# Patient Record
Sex: Male | Born: 2000 | Race: Black or African American | Hispanic: No | Marital: Single | Smoking: Never smoker
Health system: Southern US, Community
[De-identification: ages and names within clinical notes are randomized; demographics above are authoritative.]

## PROBLEM LIST (undated history)

## (undated) DIAGNOSIS — L309 Dermatitis, unspecified: Secondary | ICD-10-CM

## (undated) DIAGNOSIS — N189 Chronic kidney disease, unspecified: Secondary | ICD-10-CM

## (undated) DIAGNOSIS — J45909 Unspecified asthma, uncomplicated: Secondary | ICD-10-CM

---

## 2018-02-04 ENCOUNTER — Other Ambulatory Visit: Payer: Self-pay

## 2018-02-04 ENCOUNTER — Encounter (HOSPITAL_COMMUNITY): Payer: Self-pay | Admitting: Emergency Medicine

## 2018-02-04 ENCOUNTER — Emergency Department (HOSPITAL_COMMUNITY)
Admission: EM | Admit: 2018-02-04 | Discharge: 2018-02-04 | Disposition: A | Discharge status: Home / Self Care | Payer: No Typology Code available for payment source | Attending: Emergency Medicine | Admitting: Emergency Medicine

## 2018-02-04 ENCOUNTER — Emergency Department (HOSPITAL_COMMUNITY): Payer: No Typology Code available for payment source

## 2018-02-04 DIAGNOSIS — J4521 Mild intermittent asthma with (acute) exacerbation: Secondary | ICD-10-CM | POA: Diagnosis not present

## 2018-02-04 DIAGNOSIS — R05 Cough: Secondary | ICD-10-CM | POA: Diagnosis present

## 2018-02-04 DIAGNOSIS — B9789 Other viral agents as the cause of diseases classified elsewhere: Secondary | ICD-10-CM | POA: Diagnosis not present

## 2018-02-04 DIAGNOSIS — Z79899 Other long term (current) drug therapy: Secondary | ICD-10-CM | POA: Insufficient documentation

## 2018-02-04 DIAGNOSIS — J069 Acute upper respiratory infection, unspecified: Secondary | ICD-10-CM | POA: Diagnosis not present

## 2018-02-04 HISTORY — DX: Dermatitis, unspecified: L30.9

## 2018-02-04 HISTORY — DX: Unspecified asthma, uncomplicated: J45.909

## 2018-02-04 MED ORDER — ALBUTEROL SULFATE (2.5 MG/3ML) 0.083% IN NEBU
5.0000 mg | INHALATION_SOLUTION | Freq: Once | RESPIRATORY_TRACT | Status: AC
  Administered 2018-02-04: 5 mg via RESPIRATORY_TRACT
  Filled 2018-02-04: qty 6

## 2018-02-04 MED ORDER — ALBUTEROL SULFATE HFA 108 (90 BASE) MCG/ACT IN AERS
2.0000 | INHALATION_SPRAY | RESPIRATORY_TRACT | Status: DC | PRN
  Administered 2018-02-04: 2 via RESPIRATORY_TRACT
  Filled 2018-02-04: qty 6.7

## 2018-02-04 MED ORDER — IPRATROPIUM BROMIDE 0.02 % IN SOLN
0.5000 mg | Freq: Once | RESPIRATORY_TRACT | Status: AC
  Administered 2018-02-04: 0.5 mg via RESPIRATORY_TRACT
  Filled 2018-02-04: qty 2.5

## 2018-02-04 MED ORDER — AEROCHAMBER PLUS FLO-VU MEDIUM MISC
1.0000 | Freq: Once | Status: AC
  Administered 2018-02-04: 1
  Filled 2018-02-04: qty 1

## 2018-02-04 MED ORDER — ALBUTEROL SULFATE HFA 108 (90 BASE) MCG/ACT IN AERS: 2.0000 | INHALATION_SPRAY | RESPIRATORY_TRACT | 3 refills | Status: DC | PRN

## 2018-02-04 MED ORDER — PREDNISONE 20 MG PO TABS: 40.0000 mg | ORAL_TABLET | Freq: Every day | ORAL | 0 refills | Status: DC

## 2018-02-04 MED ORDER — PREDNISONE 20 MG PO TABS
60.0000 mg | ORAL_TABLET | Freq: Once | ORAL | Status: AC
  Administered 2018-02-04: 60 mg via ORAL
  Filled 2018-02-04: qty 3

## 2018-02-04 NOTE — Discharge Instructions (Addendum)
1. Medications: albuterol, prednisone, usual home medications °2. Treatment: rest, drink plenty of fluids, begin OTC antihistamine (Zyrtec or Claritin)  °3. Follow Up: Please followup with your primary doctor in 2-3 days for discussion of your diagnoses and further evaluation after today's visit; if you do not have a primary care doctor use the resource guide provided to find one; Please return to the ER for difficulty breathing, high fevers or worsening symptoms. ° °

## 2018-02-04 NOTE — ED Triage Notes (Signed)
Pt reports waking with wheezing and difficulty breathing tonight. Pt reports prior hx of asthma and not taking any medications.

## 2018-02-04 NOTE — ED Provider Notes (Signed)
[facility]-EMERGENCY DEPT Provider Note   CSN: [id number] Arrival date & time: 02/04/18  0101     History   Chief Complaint Chief Complaint  Patient presents with  . Asthma    HPI Juan Pacheco is a 17 y.o. male with a hx of asthma presents to the Emergency Department complaining of gradual, persistent, progressively worsening cough, wheezing and shortness of breath onset 3 days ago.  Mother reports patient has had URI symptoms for approximately 1 week including cough, congestion, postnasal drip.  Patient reports associated sore throat as well.  Mother denies use of albuterol, Atrovent or steroids in the last 10 years.  She denies previous hospitalization or intubation for his asthma.  Patient reports that he requested transport to the emergency department tonight because he felt very short of breath at home.  On arrival, he was given albuterol treatment and he reports this has made him significantly better.  Nothing at home made him significantly worse.  Patient and mother deny fever, chills, headache, neck pain, neck stiffness, chest pain, abdominal pain, nausea, vomiting, diarrhea, weakness, dizziness, syncope.  The history is provided by the patient, medical records and a parent. No language interpreter was used.    Past Medical History:  Diagnosis Date  . Asthma   . Eczema     There are no active problems to display for this patient.   History reviewed. No pertinent surgical history.      Home Medications    Prior to Admission medications   Medication Sig Start Date End Date Taking? Authorizing Provider  albuterol (PROVENTIL HFA;VENTOLIN HFA) 108 (90 Base) MCG/ACT inhaler Inhale 2 puffs into the lungs every 4 (four) hours as needed for wheezing or shortness of breath. 02/04/18   Kemper Heupel, Dahlia Client, PA-C  predniSONE (DELTASONE) 20 MG tablet Take 2 tablets (40 mg total) by mouth daily. 02/04/18   Arieon Corcoran, Boyd Kerbs    Family History History  reviewed. No pertinent family history.  Social History Social History   Tobacco Use  . Smoking status: Never Smoker  . Smokeless tobacco: Never Used  Substance Use Topics  . Alcohol use: Never    Frequency: Never  . Drug use: Never     Allergies   Peanut-containing drug products   Review of Systems Review of Systems  Constitutional: Negative for appetite change, chills, fatigue and fever.  HENT: Positive for congestion, postnasal drip, rhinorrhea, sinus pressure and sore throat. Negative for ear discharge, ear pain and mouth sores.   Eyes: Negative for visual disturbance.  Respiratory: Positive for cough, chest tightness, shortness of breath and wheezing. Negative for stridor.   Cardiovascular: Negative for chest pain, palpitations and leg swelling.  Gastrointestinal: Negative for abdominal pain, diarrhea, nausea and vomiting.  Genitourinary: Negative for dysuria, frequency, hematuria and urgency.  Musculoskeletal: Negative for arthralgias, back pain, myalgias and neck stiffness.  Skin: Negative for rash.  Neurological: Negative for syncope, light-headedness, numbness and headaches.  Hematological: Negative for adenopathy.  Psychiatric/Behavioral: The patient is not nervous/anxious.   All other systems reviewed and are negative.    Physical Exam Updated Vital Signs BP (!) 120/96 (BP Location: Left Arm)   Pulse 78   Temp 98.2 F (36.8 C) (Oral)   Resp 18   Ht  (1.93 m)   Wt 68 kg (150 lb)   SpO2 100%   BMI 18.26 kg/m   Physical Exam  Constitutional: He appears well-developed and well-nourished. No distress.  HENT:  Head: Normocephalic and atraumatic.  Right Ear: Tympanic membrane, external ear and ear canal normal.  Left Ear: Tympanic membrane, external ear and ear canal normal.  Nose: Mucosal edema and rhinorrhea present. No epistaxis. Right sinus exhibits no maxillary sinus tenderness and no frontal sinus tenderness. Left sinus exhibits no maxillary sinus  tenderness and no frontal sinus tenderness.  Mouth/Throat: Uvula is midline and mucous membranes are normal. Mucous membranes are not pale and not cyanotic. No oropharyngeal exudate, posterior oropharyngeal edema, posterior oropharyngeal erythema or tonsillar abscesses.  Eyes: Pupils are equal, round, and reactive to light. Conjunctivae are normal.  Neck: Normal range of motion and full passive range of motion without pain.  Cardiovascular: Normal rate and intact distal pulses.  Pulmonary/Chest: Effort normal. No accessory muscle usage or stridor. No tachypnea. No respiratory distress. He has wheezes. He has no rhonchi. He has no rales.  Mild, expiratory wheezes throughout  Abdominal: Soft. There is no tenderness.  Musculoskeletal: Normal range of motion.  Lymphadenopathy:    He has no cervical adenopathy.  Neurological: He is alert.  Skin: Skin is warm and dry. No rash noted. He is not diaphoretic.  Psychiatric: He has a normal mood and affect.  Nursing note and vitals reviewed.    ED Treatments / Results   Radiology Dg Chest 2 View  Result Date: 02/04/2018 CLINICAL DATA:  Acute onset of shortness of breath, cough and wheezing. EXAM: CHEST - 2 VIEW COMPARISON:  None. FINDINGS: The lungs are well-aerated and clear. There is no evidence of focal opacification, pleural effusion or pneumothorax. The heart is normal in size; the mediastinal contour is within normal limits. No acute osseous abnormalities are seen. IMPRESSION: No acute cardiopulmonary process seen. Electronically Signed   By: Roanna Raider M.D.   On: 02/04/2018 02:15    Procedures Procedures (including critical care time)  Medications Ordered in ED Medications  albuterol (PROVENTIL HFA;VENTOLIN HFA) 108 (90 Base) MCG/ACT inhaler 2 puff (2 puffs Inhalation Given 02/04/18 0220)  albuterol (PROVENTIL) (2.5 MG/3ML) 0.083% nebulizer solution 5 mg (5 mg Nebulization Given 02/04/18 0115)  predniSONE (DELTASONE) tablet 60 mg (60 mg  Oral Given 02/04/18 0219)  albuterol (PROVENTIL) (2.5 MG/3ML) 0.083% nebulizer solution 5 mg (5 mg Nebulization Given 02/04/18 0222)  ipratropium (ATROVENT) nebulizer solution 0.5 mg (0.5 mg Nebulization Given 02/04/18 0222)  AEROCHAMBER PLUS FLO-VU MEDIUM MISC 1 each (1 each Other Given 02/04/18 0221)     Initial Impression / Assessment and Plan / ED Course  I have reviewed the triage vital signs and the nursing notes.  Pertinent labs & imaging results that were available during my care of the patient were reviewed by me and considered in my medical decision making (see chart for details).  Clinical Course as of Feb 05 239  Tue Feb 04, 2018  0237 No evidence of pneumonia, pneumothorax or pulmonary edema.  I personally evaluated these images.  DG Chest 2 View [HM]  [phone] Patient is afebrile  Temp: 98.2 F (36.8 C) [HM]  0237 No tachycardia.  Pulse Rate: 78 [HM]    Clinical Course User Index [HM] Burris Matherne, Dahlia Client, PA-C    Patient presents with cough and shortness of breath.  He has history of asthma but has not needed medications for many years.  Additionally, patient has URI symptoms.  Chest x-ray without evidence of pneumonia, pneumothorax or pulmonary edema.  Suspect that viral URI/viral bronchitis caused asthma exacerbation.  Patient given initial Nebules treatment in triage with significant improvement in subjective breathing.  Mild expiratory wheezes persist  on my exam.  Will give additional albuterol and reassess.  2:35 AM Patient ambulated in ED with normal O2 saturations, no current signs of respiratory distress. Lung exam improved after nebulizer treatment. Prednisone given in the ED and pt will be discharged with 5 day burst. Pt states they are breathing at baseline. Pt has been instructed to continue using prescribed medications and to speak with PCP about today's exacerbation.    Final Clinical Impressions(s) / ED Diagnoses   Final diagnoses:  Mild intermittent asthma  with exacerbation  Viral URI with cough    ED Discharge Orders        Ordered    predniSONE (DELTASONE) 20 MG tablet  Daily     02/04/18 0234    albuterol (PROVENTIL HFA;VENTOLIN HFA) 108 (90 Base) MCG/ACT inhaler  Every 4 hours PRN     02/04/18 0234       Shant Hence, Dahlia Client, PA-C 02/04/18 0240    Horton, Mayer Masker, MD 02/04/18 [phone]

## 2020-04-07 ENCOUNTER — Other Ambulatory Visit: Payer: Self-pay | Admitting: Internal Medicine

## 2020-04-07 DIAGNOSIS — N1832 Chronic kidney disease, stage 3b: Secondary | ICD-10-CM

## 2020-04-15 ENCOUNTER — Other Ambulatory Visit: Payer: No Typology Code available for payment source

## 2020-05-19 ENCOUNTER — Ambulatory Visit: Payer: Medicaid Other | Attending: Internal Medicine

## 2020-05-19 DIAGNOSIS — Z23 Encounter for immunization: Secondary | ICD-10-CM

## 2020-05-19 NOTE — Progress Notes (Signed)
   Covid-19 Vaccination Clinic  Name:  Juan Pacheco    MRN: [id number] DOB: September 03, 2001  05/19/2020  Mr. Juan Pacheco was observed post Covid-19 immunization for 30 minutes based on pre-vaccination screening without incident. He was provided with Vaccine Information Sheet and instruction to access the V-Safe system.   Mr. Juan Pacheco was instructed to call 911 with any severe reactions post vaccine: [name] Difficulty breathing  . Swelling of face and throat  . A fast heartbeat  . A bad rash all over body  . Dizziness and weakness   Immunizations Administered    Name Date Dose VIS Date Route   Pfizer COVID-19 Vaccine 05/19/2020 12:09 PM 0.3 mL 11/04/2018 Intramuscular   Manufacturer: [employer], [employer]   Lot: [id number]   NDC: [address]-5

## 2020-06-14 ENCOUNTER — Ambulatory Visit: Payer: Medicaid Other

## 2020-06-16 ENCOUNTER — Ambulatory Visit: Payer: Medicaid Other | Attending: Internal Medicine

## 2020-06-16 DIAGNOSIS — Z23 Encounter for immunization: Secondary | ICD-10-CM

## 2020-06-16 NOTE — Progress Notes (Signed)
   Covid-19 Vaccination Clinic  Name:  [name]    MRN: [id number] DOB: 09-08-2001  06/16/2020  Mr. [name] was observed post Covid-19 immunization for 15 minutes without incident. He was provided with Vaccine Information Sheet and instruction to access the V-Safe system.   Mr. Grivas was instructed to call 911 with any severe reactions post vaccine: [name] Difficulty breathing  . Swelling of face and throat  . A fast heartbeat  . A bad rash all over body  . Dizziness and weakness   Immunizations Administered    Name Date Dose VIS Date Route   Pfizer COVID-19 Vaccine 06/16/2020  9:51 AM 0.3 mL 11/04/2018 Intramuscular   Manufacturer: [employer], [employer]   Lot: [id number]   NDC: [address]-3

## 2020-07-25 ENCOUNTER — Other Ambulatory Visit: Payer: Self-pay | Admitting: Nephrology

## 2020-07-25 DIAGNOSIS — R7989 Other specified abnormal findings of blood chemistry: Secondary | ICD-10-CM

## 2020-07-25 DIAGNOSIS — R809 Proteinuria, unspecified: Secondary | ICD-10-CM

## 2020-08-03 ENCOUNTER — Other Ambulatory Visit: Payer: Medicaid Other

## 2020-08-31 ENCOUNTER — Ambulatory Visit
Admission: RE | Admit: 2020-08-31 | Discharge: 2020-08-31 | Disposition: A | Discharge status: Home / Self Care | Payer: Medicaid Other | Source: Ambulatory Visit | Attending: Nephrology | Admitting: Nephrology

## 2020-08-31 DIAGNOSIS — R7989 Other specified abnormal findings of blood chemistry: Secondary | ICD-10-CM

## 2020-08-31 DIAGNOSIS — R809 Proteinuria, unspecified: Secondary | ICD-10-CM

## 2021-01-19 ENCOUNTER — Other Ambulatory Visit (HOSPITAL_COMMUNITY): Payer: Self-pay | Admitting: Nephrology

## 2021-01-19 DIAGNOSIS — R7989 Other specified abnormal findings of blood chemistry: Secondary | ICD-10-CM

## 2021-01-26 ENCOUNTER — Other Ambulatory Visit: Payer: Self-pay | Admitting: Radiology

## 2021-01-27 ENCOUNTER — Other Ambulatory Visit (HOSPITAL_COMMUNITY): Payer: Self-pay | Admitting: Nephrology

## 2021-01-27 ENCOUNTER — Ambulatory Visit (HOSPITAL_COMMUNITY)
Admission: RE | Admit: 2021-01-27 | Discharge: 2021-01-27 | Disposition: A | Discharge status: Home / Self Care | Payer: 59 | Source: Ambulatory Visit | Attending: Nephrology | Admitting: Nephrology

## 2021-01-27 ENCOUNTER — Other Ambulatory Visit: Payer: Self-pay

## 2021-01-27 ENCOUNTER — Encounter (HOSPITAL_COMMUNITY): Payer: Self-pay

## 2021-01-27 DIAGNOSIS — N189 Chronic kidney disease, unspecified: Secondary | ICD-10-CM | POA: Diagnosis not present

## 2021-01-27 DIAGNOSIS — M3214 Glomerular disease in systemic lupus erythematosus: Secondary | ICD-10-CM | POA: Diagnosis not present

## 2021-01-27 DIAGNOSIS — R7989 Other specified abnormal findings of blood chemistry: Secondary | ICD-10-CM | POA: Diagnosis present

## 2021-01-27 DIAGNOSIS — Z79899 Other long term (current) drug therapy: Secondary | ICD-10-CM | POA: Insufficient documentation

## 2021-01-27 HISTORY — DX: Chronic kidney disease, unspecified: N18.9

## 2021-01-27 HISTORY — PX: IR US GUIDE BX ASP/DRAIN: IMG2392

## 2021-01-27 LAB — CBC
HCT: 48.5 % (ref 39.0–52.0)
Hemoglobin: 15.8 g/dL (ref 13.0–17.0)
MCH: 28.7 pg (ref 26.0–34.0)
MCHC: 32.6 g/dL (ref 30.0–36.0)
MCV: 88.2 fL (ref 80.0–100.0)
Platelets: 218 10*3/uL (ref 150–400)
RBC: 5.5 MIL/uL (ref 4.22–5.81)
RDW: 12.7 % (ref 11.5–15.5)
WBC: 7.2 10*3/uL (ref 4.0–10.5)
nRBC: 0 % (ref 0.0–0.2)

## 2021-01-27 LAB — PROTIME-INR
INR: 1 (ref 0.8–1.2)
Prothrombin Time: 13.4 seconds (ref 11.4–15.2)

## 2021-01-27 MED ORDER — MIDAZOLAM HCL 2 MG/2ML IJ SOLN
INTRAMUSCULAR | Status: AC
  Filled 2021-01-27: qty 2

## 2021-01-27 MED ORDER — LIDOCAINE HCL (PF) 1 % IJ SOLN
INTRAMUSCULAR | Status: AC
  Filled 2021-01-27: qty 30

## 2021-01-27 MED ORDER — FENTANYL CITRATE (PF) 100 MCG/2ML IJ SOLN
INTRAMUSCULAR | Status: AC | PRN
  Administered 2021-01-27: 50 ug via INTRAVENOUS

## 2021-01-27 MED ORDER — MIDAZOLAM HCL 2 MG/2ML IJ SOLN
INTRAMUSCULAR | Status: AC | PRN
  Administered 2021-01-27: 1 mg via INTRAVENOUS

## 2021-01-27 MED ORDER — SODIUM CHLORIDE 0.9 % IV SOLN: INTRAVENOUS | Status: DC

## 2021-01-27 MED ORDER — FENTANYL CITRATE (PF) 100 MCG/2ML IJ SOLN
INTRAMUSCULAR | Status: AC
  Filled 2021-01-27: qty 2

## 2021-01-27 MED ORDER — GELATIN ABSORBABLE 12-7 MM EX MISC
CUTANEOUS | Status: AC
  Filled 2021-01-27: qty 1

## 2021-01-27 NOTE — Procedures (Signed)
Interventional Radiology Procedure Note  Procedure: [place] LEFT RENAL RANDOM CORE BX    Complications: None  Estimated Blood Loss:  MIN  Findings: 16 G CORE X 2     M. Ruel Favors, MD

## 2021-01-27 NOTE — Discharge Instructions (Addendum)
Percutaneous Kidney Biopsy, Care After This sheet gives you information about how to care for yourself after your procedure. Your health care provider may also give you more specific instructions. If you have problems or questions, contact your health care provider. What can I expect after the procedure? After the procedure, it is common to have:  Pain or soreness near the biopsy site.  Pink or cloudy urine for 24 hours after the procedure. This is normal. Follow these instructions at home: Activity  Return to your normal activities as told by your health care provider. Ask your health care provider what activities are safe for you.  If you were given a sedative during the procedure, it can affect you for several hours. Do not drive or operate machinery until your health care provider says that it is safe.  Do not lift anything that is heavier than 10 lb (4.5 kg), or the limit that you are told, until your health care provider says that it is safe.  Avoid activities that take a lot of effort until your health care provider approves. Most people will have to wait 2 weeks before returning to activities such as exercise or sex. General instructions  Take over-the-counter and prescription medicines only as told by your health care provider.  Follow instructions from your health care provider about eating or drinking restrictions.  Check your biopsy site every day for signs of infection. Check for: ? More redness, swelling, or pain. ? Fluid or blood. ? Warmth. ? Pus or a bad smell.  Keep all follow-up visits as told by your health care provider. This is important.   Contact a health care provider if:  You have more redness, swelling, or pain around your biopsy site.  You have fluid or blood coming from your biopsy site.  Your biopsy site feels warm to the touch.  You have pus or a bad smell coming from your biopsy site.  You have blood in your urine more than 24 hours after your  procedure. Get help right away if:  Your urine is dark red or brown.  You have a fever.  You are not able to urinate.  You feel burning when you urinate.  You feel dizzy or light-headed.  You have severe pain in your abdomen or side. Summary  After the procedure, it is common to have pain or soreness at the biopsy site and pink or cloudy urine for the first 24 hours.  Check your biopsy site each day for signs of infection, such as more redness, swelling, or pain; fluid, blood, pus or a bad smell coming from the biopsy site; or the biopsy site feeling warm to the touch.  Return to your normal activities as told by your health care provider. This information is not intended to replace advice given to you by your health care provider. Make sure you discuss any questions you have with your health care provider. Document Revised: 11/20/2019 Document Reviewed: 11/20/2019 Elsevier Patient Education  2021 Elsevier Inc. Moderate Conscious Sedation, Adult Sedation is the use of medicines to promote relaxation and to relieve discomfort and anxiety. Moderate conscious sedation is a type of sedation. Under moderate conscious sedation, you are less alert than normal, but you are still able to respond to instructions, touch, or both. Moderate conscious sedation is used during short medical and dental procedures. It is milder than deep sedation, which is a type of sedation under which you cannot be easily woken up. It is also milder than   general anesthesia, which is the use of medicines to make you unconscious. Moderate conscious sedation allows you to return to your regular activities sooner. Tell a health care provider about:  Any allergies you have.  All medicines you are taking, including vitamins, herbs, eye drops, creams, and over-the-counter medicines.  Any use of steroids. This includes steroids taken by mouth or as a cream.  Any problems you or family members have had with sedatives and  anesthetic medicines.  Any blood disorders you have.  Any surgeries you have had.  Any medical conditions you have, such as sleep apnea.  Whether you are pregnant or may be pregnant.  Any use of cigarettes, alcohol, marijuana, or drugs. What are the risks? Generally, this is a safe procedure. However, problems may occur, including:  Getting too much medicine (oversedation).  Nausea.  Allergic reaction to medicines.  Trouble breathing. If this happens, a breathing tube may be used. It will be removed when you are awake and breathing on your own.  Heart trouble.  Lung trouble.  Confusion that gets better with time (emergence delirium). What happens before the procedure? Staying hydrated Follow instructions from your health care provider about hydration, which may include:  Up to 2 hours before the procedure - you may continue to drink clear liquids, such as water, clear fruit juice, black coffee, and plain tea. Eating and drinking restrictions Follow instructions from your health care provider about eating and drinking, which may include:  8 hours before the procedure - stop eating heavy meals or foods, such as meat, fried foods, or fatty foods.  6 hours before the procedure - stop eating light meals or foods, such as toast or cereal.  6 hours before the procedure - stop drinking milk or drinks that contain milk.  2 hours before the procedure - stop drinking clear liquids. Medicines Ask your health care provider about:  Changing or stopping your regular medicines. This is especially important if you are taking diabetes medicines or blood thinners.  Taking medicines such as aspirin and ibuprofen. These medicines can thin your blood. Do not take these medicines unless your health care provider tells you to take them.  Taking over-the-counter medicines, vitamins, herbs, and supplements. Tests and exams  You will have a physical exam.  You may have blood tests done to  show how well: ? Your kidneys and liver work. ? Your blood clots. General instructions  Plan to have a responsible adult take you home from the hospital or clinic.  If you will be going home right after the procedure, plan to have a responsible adult care for you for the time you are told. This is important. What happens during the procedure?  You will be given the sedative. The sedative may be given: ? As a pill that you will swallow. It can also be inserted into the rectum. ? As a spray through the nose. ? As an injection into the muscle. ? As an injection into the vein through an IV.  You may be given oxygen as needed.  Your breathing, heart rate, and blood pressure will be monitored during the procedure.  The medical or dental procedure will be done. The procedure may vary among health care providers and hospitals.   What happens after the procedure?  Your blood pressure, heart rate, breathing rate, and blood oxygen level will be monitored until you leave the hospital or clinic.  You will get fluids through your IV if needed.  Do not drive   or operate machinery until your health care provider says that it is safe. Summary  Sedation is the use of medicines to promote relaxation and to relieve discomfort and anxiety. Moderate conscious sedation is a type of sedation that is used during short medical and dental procedures.  Tell the health care provider about any medical conditions that you have and about all the medicines that you are taking.  You will be given the sedative as a pill, a spray through the nose, an injection into the muscle, or an injection into the vein through an IV. Vital signs are monitored during the sedation.  Moderate conscious sedation allows you to return to your regular activities sooner. This information is not intended to replace advice given to you by your health care provider. Make sure you discuss any questions you have with your health care  provider. Document Revised: 12/25/2019 Document Reviewed: 07/23/2019 Elsevier Patient Education  2021 Elsevier Inc.  

## 2021-01-27 NOTE — H&P (Signed)
Chief Complaint: Patient was seen in consultation today for image guided random renal biopsy at the request of Juan Pacheco  Referring Physician(s): Juan Pacheco  Supervising Physician: Juan Pacheco  Patient Status: [facility] - Out-pt  History of Present Illness: Juan Pacheco is a 20 y.o. male with PMH of asthma, eczema, CKD who has been referred for image guided random renal biopsy per his nephrologist due to elevated creatinine with unknown etiology.  Patient was seen by Dr. Thedore Pacheco on 01/11/2021, after thorough discussion and shared decision making, a random renal biopsy was recommended to the patient for further evaluation of elevated creatinine with unknown etiology.  Patient agreeable to proceed with the procedure.  IR was requested image guided random renal biopsy.  Patient laying in bed, not in acute distress.  Denise headache, fever, chills, shortness of breath, cough, chest pain, abdominal pain, nausea ,vomiting, and bleeding.   Past Medical History:  Diagnosis Date  . Asthma   . Chronic kidney disease   . Eczema     History reviewed. No pertinent surgical history.  Allergies: Other  Medications: Prior to Admission medications   Medication Sig Start Date End Date Taking? Authorizing Provider  albuterol (PROVENTIL HFA;VENTOLIN HFA) 108 (90 Base) MCG/ACT inhaler Inhale 2 puffs into the lungs every 4 (four) hours as needed for wheezing or shortness of breath. 02/04/18  Yes Muthersbaugh, Dahlia Client, PA-C  cetirizine (ZYRTEC) 10 MG tablet Take 10 mg by mouth daily.   Yes [provider]  mometasone (ELOCON) 0.1 % cream Apply 1 application topically 2 (two) times daily as needed (eczema). 08/23/20  Yes [provider]     Family History  Problem Relation Age of Onset  . High blood pressure Father   . Diabetes Father     Social History   Socioeconomic History  . Marital status: Single    Spouse name: Not on file  . Number of children: Not on file  .  Years of education: Not on file  . Highest education level: Not on file  Occupational History  . Not on file  Tobacco Use  . Smoking status: Never Smoker  . Smokeless tobacco: Never Used  Vaping Use  . Vaping Use: Never used  Substance and Sexual Activity  . Alcohol use: Yes    Comment: socially  . Drug use: Never  . Sexual activity: Not on file  Other Topics Concern  . Not on file  Social History Narrative  . Not on file   Social Determinants of Health   Financial Resource Strain: Not on file  Food Insecurity: Not on file  Transportation Needs: Not on file  Physical Activity: Not on file  Stress: Not on file  Social Connections: Not on file     Review of Systems: A 12 point ROS discussed and pertinent positives are indicated in the HPI above.  All other systems are negative.   Vital Signs: BP (!) 138/95   Pulse 63   Temp 98 F (36.7 C) (Oral)   Resp 16   Ht 6\' 4"  (1.93 m)   Wt 160 lb (72.6 kg)   SpO2 100%   BMI 19.48 kg/m   Physical Exam Vitals reviewed.  Constitutional:      General: He is not in acute distress.    Appearance: Normal appearance. He is not ill-appearing.  HENT:     Head: Normocephalic and atraumatic.     Mouth/Throat:     Mouth: Mucous membranes are moist.     Pharynx: Oropharynx  is clear.  Cardiovascular:     Rate and Rhythm: Normal rate and regular rhythm.     Pulses: Normal pulses.     Heart sounds: Normal heart sounds.  Pulmonary:     Effort: Pulmonary effort is normal.     Breath sounds: Normal breath sounds.  Abdominal:     General: Abdomen is flat.     Palpations: Abdomen is soft.  Skin:    General: Skin is warm and dry.  Neurological:     Mental Status: He is alert and oriented to person, place, and time.  Psychiatric:        Mood and Affect: Mood normal.        Behavior: Behavior normal.        Judgment: Judgment normal.     MD Evaluation Airway: WNL Heart: WNL Abdomen: WNL Chest/ Lungs: WNL ASA   Classification: 2 Mallampati/Airway Score: One  Imaging: No results found.  Labs:  CBC: Recent Labs    01/27/21 0620  WBC 7.2  HGB 15.8  HCT 48.5  PLT 218    COAGS: Recent Labs    01/27/21 0620  INR 1.0    BMP: No results for input(s): NA, K, CL, CO2, GLUCOSE, BUN, CALCIUM, CREATININE, GFRNONAA, GFRAA in the last 8760 hours.  Invalid input(s): CMP  LIVER FUNCTION TESTS: No results for input(s): BILITOT, AST, ALT, ALKPHOS, PROT, ALBUMIN in the last 8760 hours.  TUMOR MARKERS: No results for input(s): AFPTM, CEA, CA199, CHROMGRNA in the last 8760 hours.  Assessment and Plan: 20 y.o. male with elevated creatinine with unknown etiology.  IR was requested for random renal biopsy for further evaluation of elevated creatinine with unknown etiology. Patient presents to IR today for the procedure. N.p.o. since midnight Not on anticoagulation/antiplatelet INR 1.0 CBC all within normal range  Risks and benefits of random renal biopsy was discussed with the patient and/or patient's family including, but not limited to bleeding, infection, damage to adjacent structures or low yield requiring additional tests.  All of the questions were answered and there is agreement to proceed.  Consent signed and in chart.    Thank you for this interesting consult.  I greatly enjoyed meeting Juan Pacheco and look forward to participating in their care.  A copy of this report was sent to the requesting provider on this date.  Electronically Signed: Willette Brace, PA-C 01/27/2021, 8:04 AM   I spent a total of  30 Minutes   in face to face in clinical consultation, greater than 50% of which was counseling/coordinating care for random renal biopsy

## 2021-01-27 NOTE — Sedation Documentation (Signed)
Attempt x 1 to call report to Short Stay RN. No available RN to receive report at this time. Will call back in 10 minutes.

## 2021-03-29 ENCOUNTER — Encounter (HOSPITAL_COMMUNITY): Payer: Self-pay

## 2021-03-29 LAB — SURGICAL PATHOLOGY

## 2022-02-14 IMAGING — US US RENAL
1 series · 14 of 25 positions shown · non-contrast
Comparison: None.

CLINICAL DATA: Elevated serum creatinine, proteinuria

EXAM:
RENAL / URINARY TRACT ULTRASOUND COMPLETE

[Series 1: us renal · 0.20mm/px · 14 of 36 slices shown]
[im 1/36]
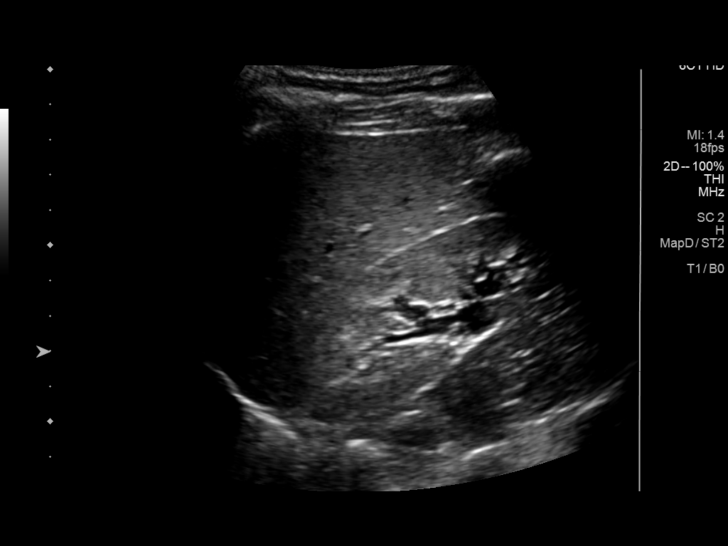
[im 3/36]
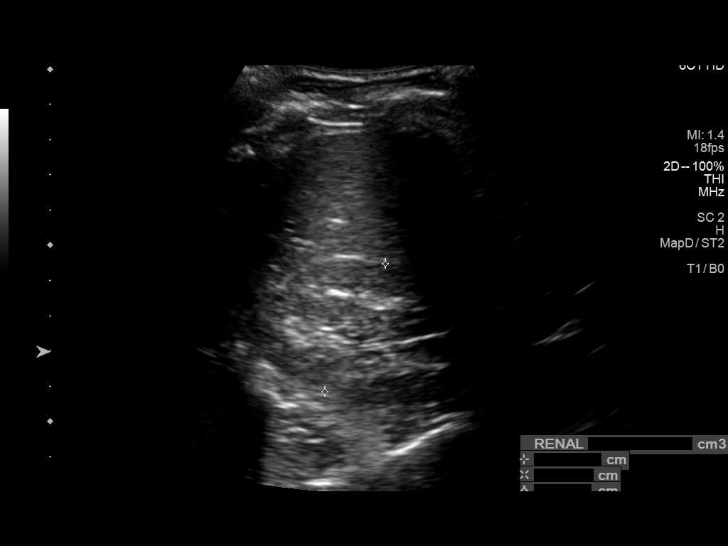
[im 6/36]
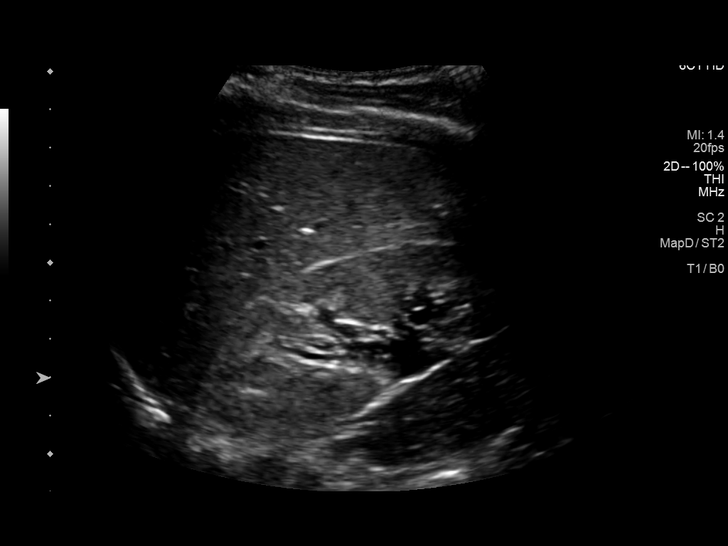
[im 9/36]
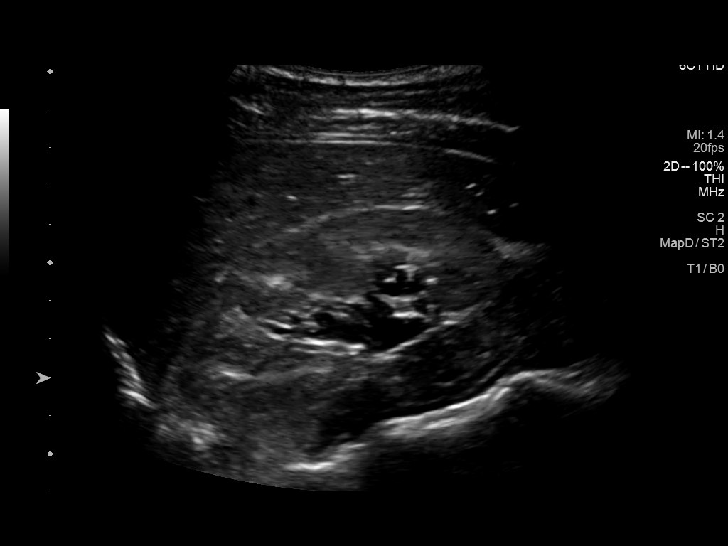
[im 12/36]
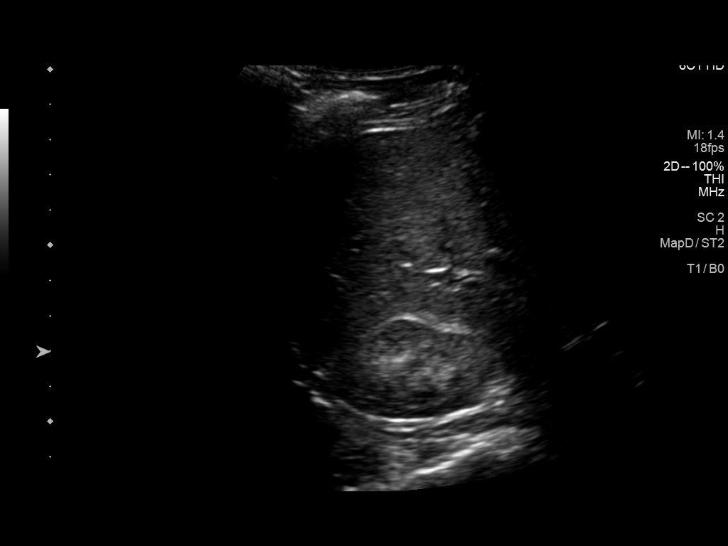
[im 14/36]
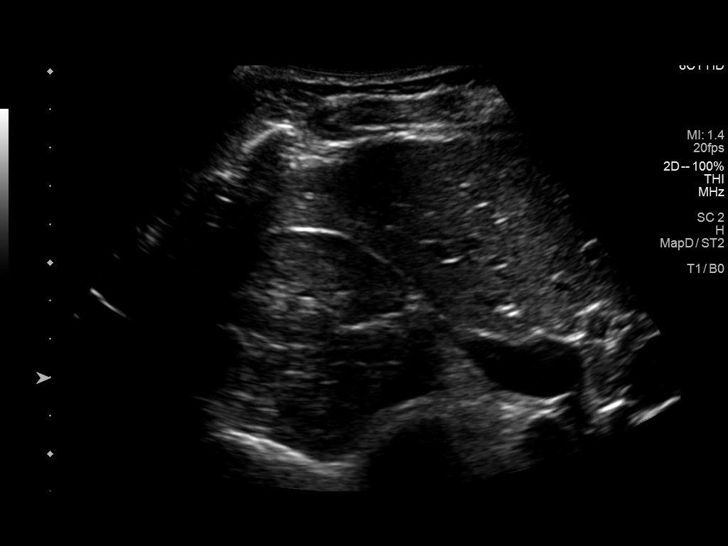
[im 17/36]
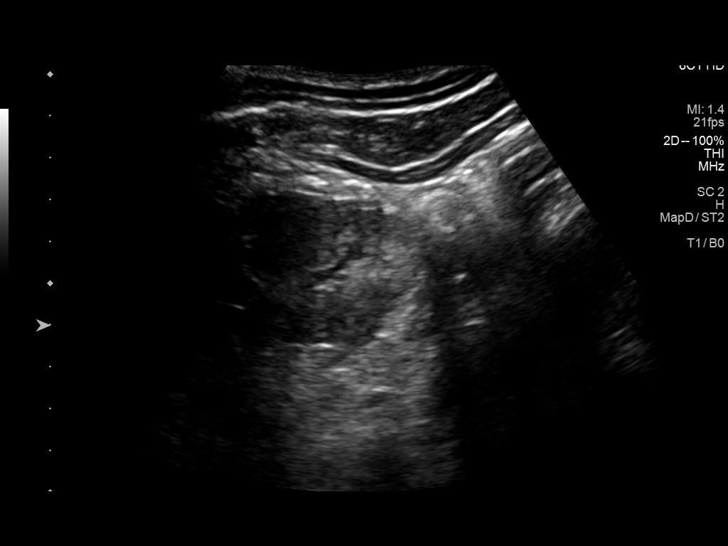
[im 19/36]
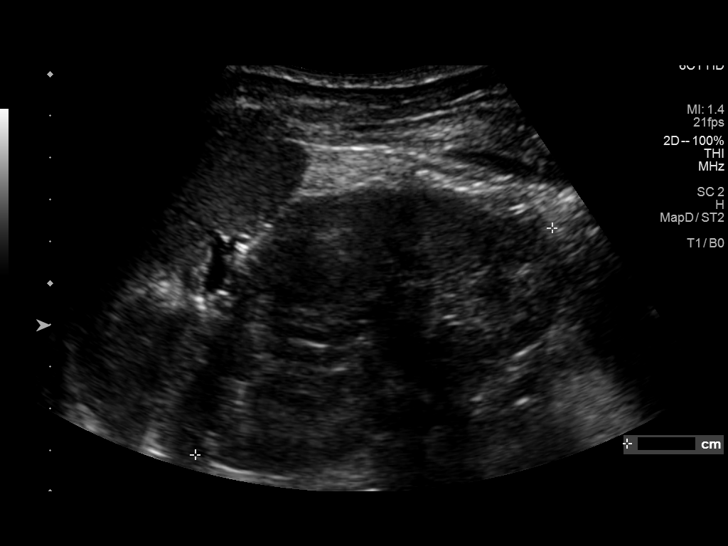
[im 22/36]
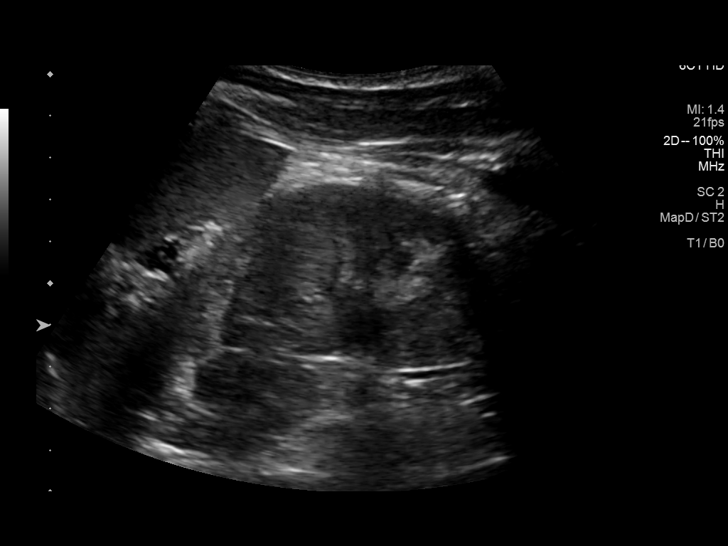
[im 24/36]
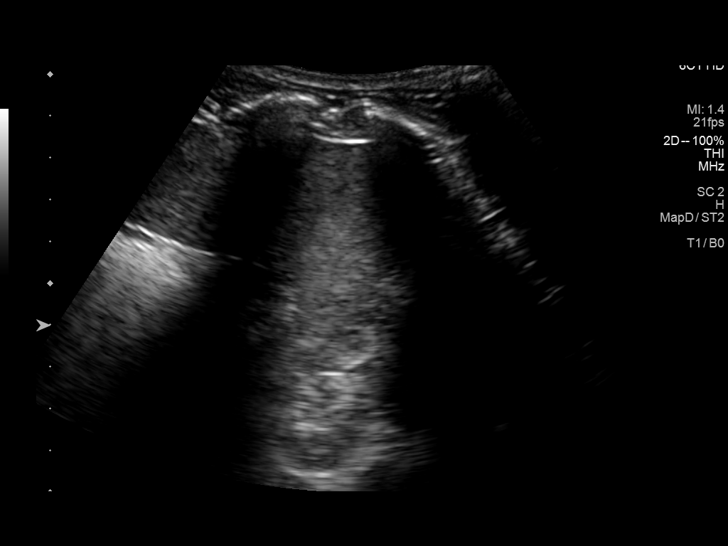
[im 27/36]
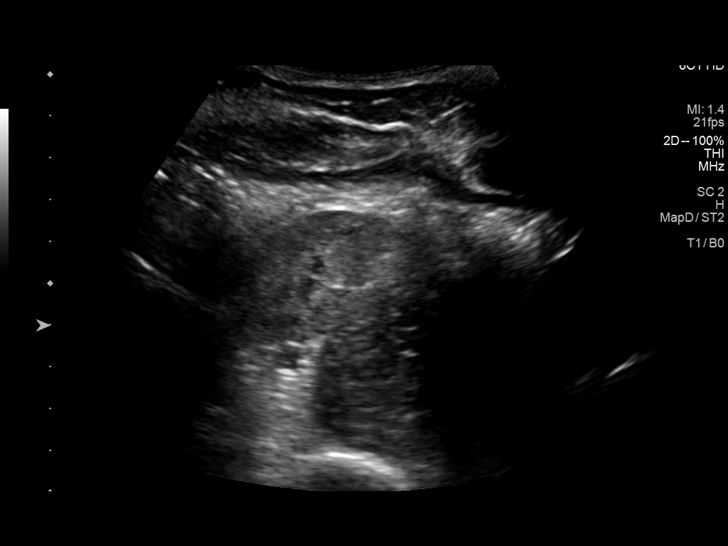
[im 30/36]
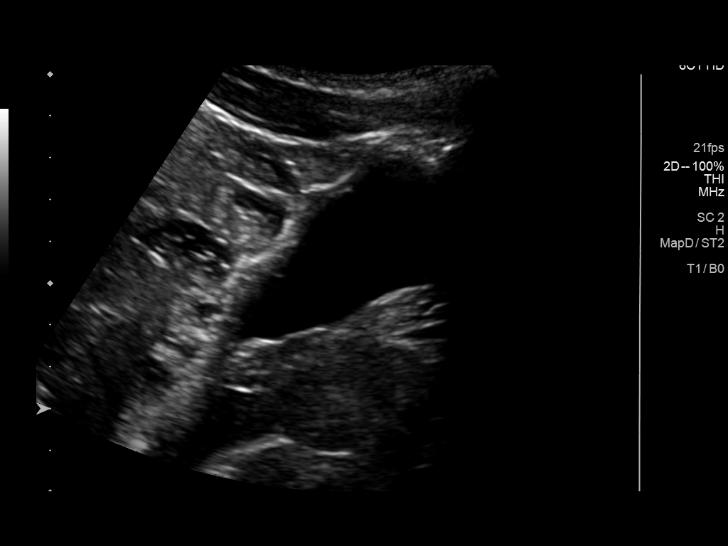
[im 33/36]
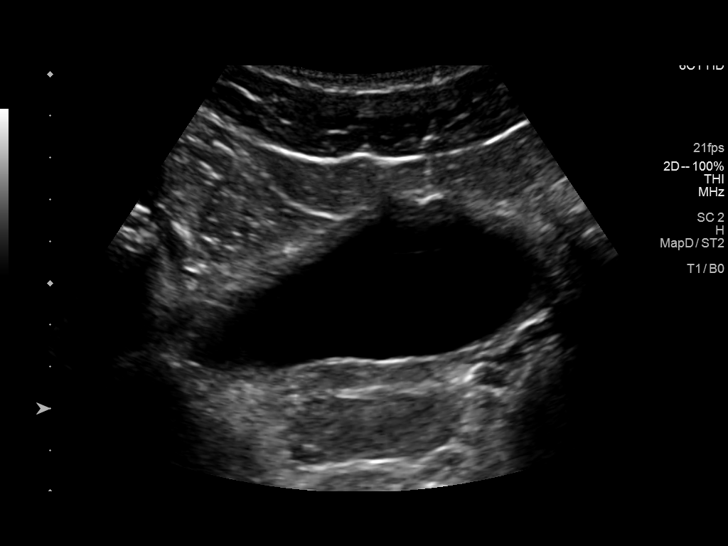
[im 36/36]
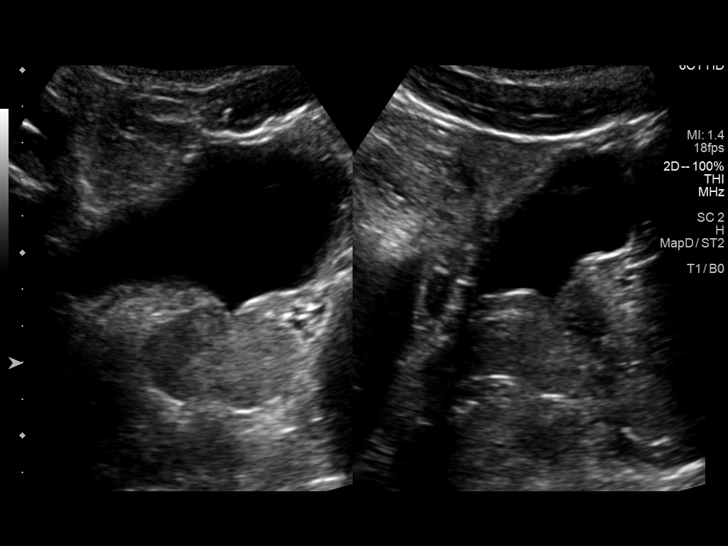

[14 of 25 positions shown; findings below may reference images not displayed]

FINDINGS: Right Kidney:

Renal measurements: 10.1 x 3.6 x 4.0 = volume: 76 mL. Echogenicity
is within normal limits. No concerning renal mass, shadowing
calculus or hydronephrosis.

Left Kidney:

Renal measurements: 9.8 x 5.9 x 4.7 cm = volume: 143 mL.
Echogenicity is within normal limits. No concerning renal mass,
shadowing calculus or hydronephrosis.

Bladder:

Appears normal for degree of bladder distention.

Other:

Prostate volume of 22 mm, within normal limits.
IMPRESSION: Unremarkable renal ultrasound.

## 2022-07-13 IMAGING — US IR US GUIDANCE
1 series · 6 of 6 positions shown · non-contrast
Comparison: none

INDICATION: Elevated creatinine renal insufficiency

[Series 1: ir us guidance · 6 of 6 slices shown]
[im 1/6]
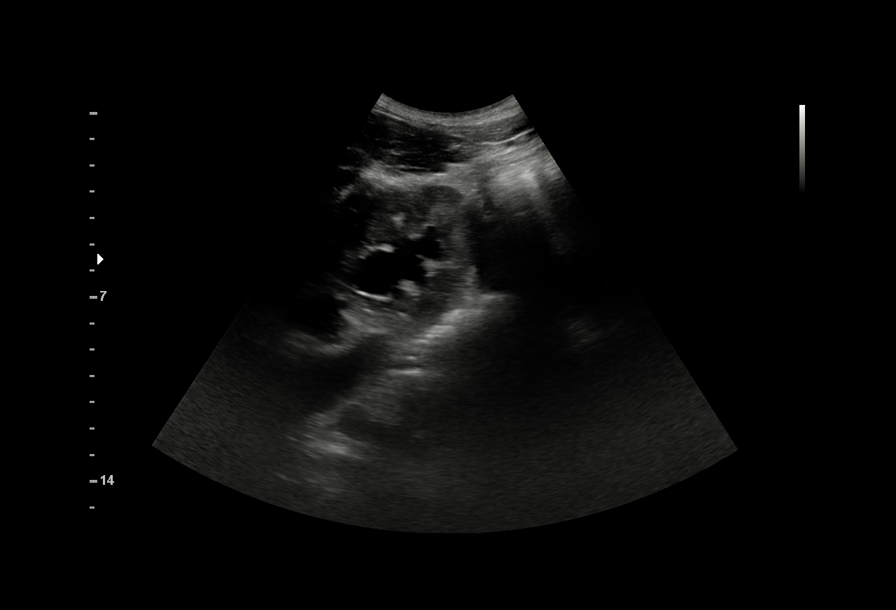
[im 2/6]
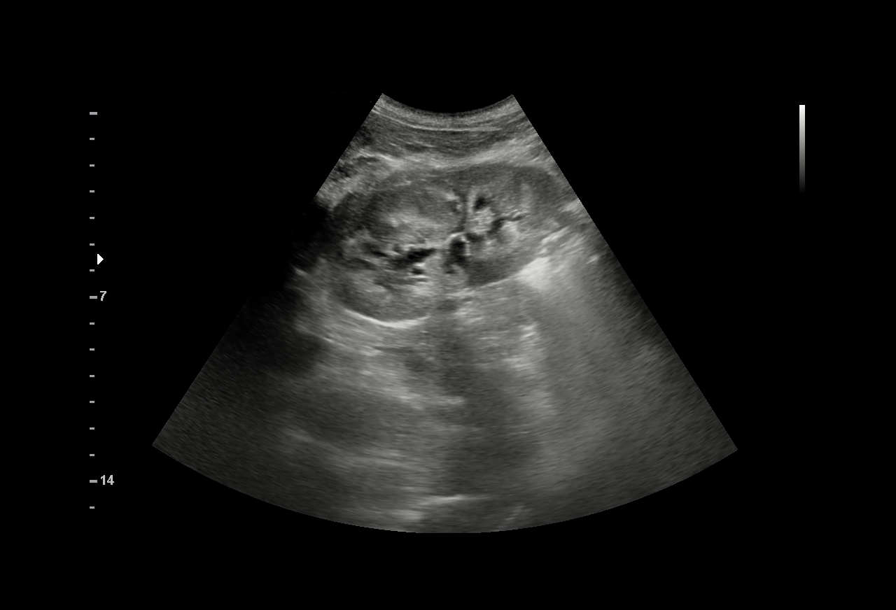
[im 3/6]
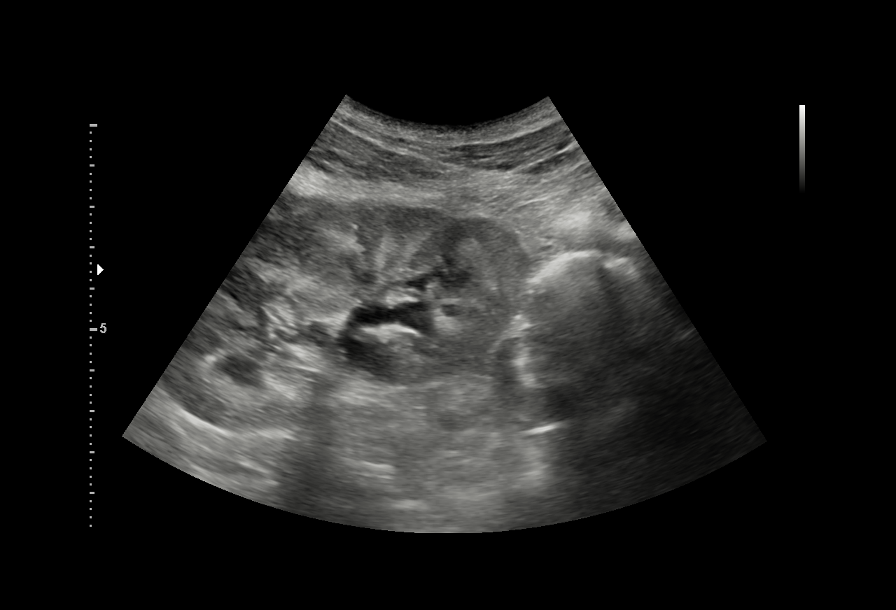
[im 4/6]
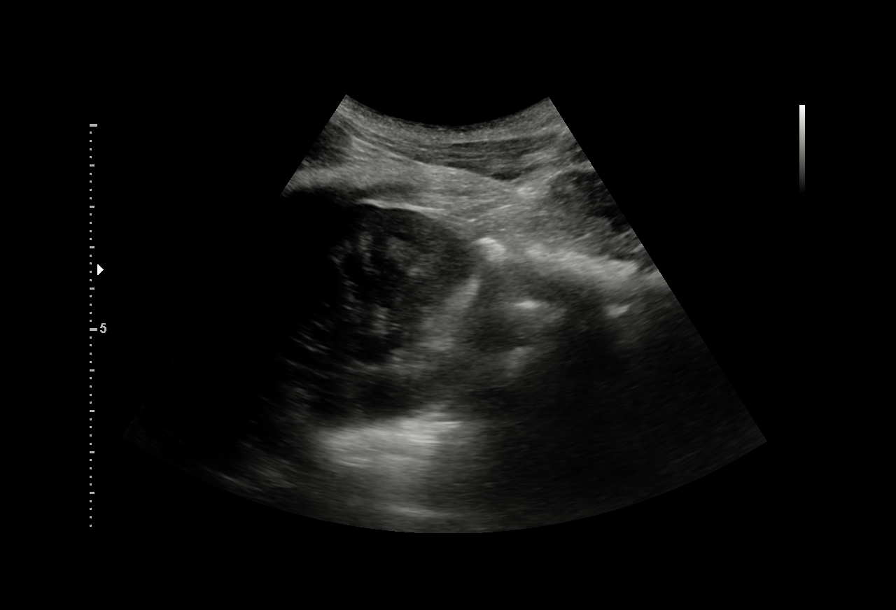
[im 5/6]
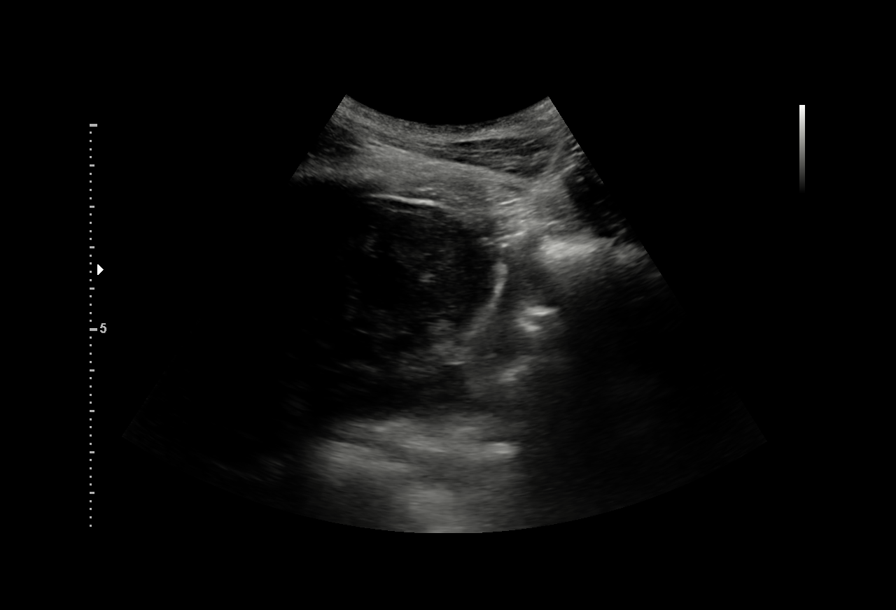
[im 6/6]
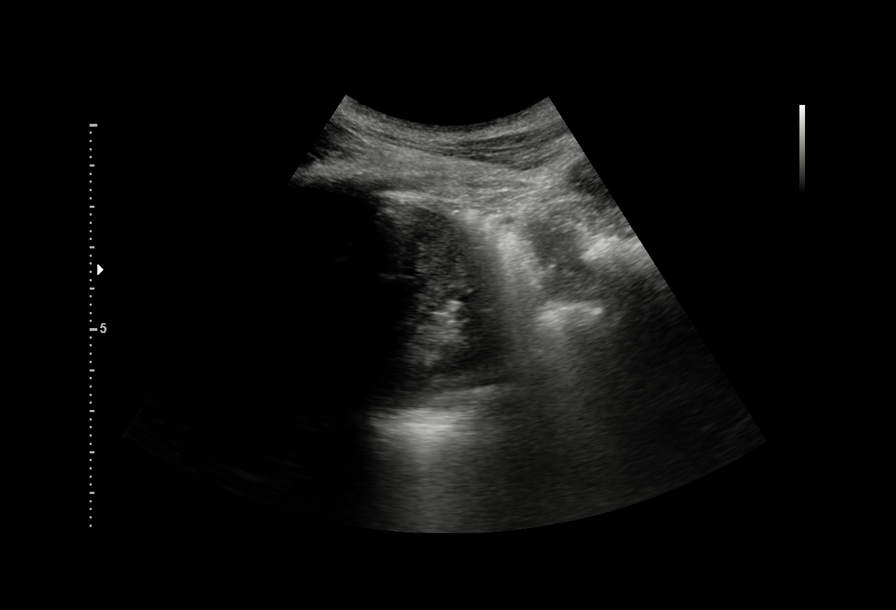

[6 of 6 positions shown; findings below may reference images not displayed]

EXAM:
ULTRASOUND RANDOM LEFT RENAL CORE BIOPSY

MEDICATIONS:
1% local

ANESTHESIA/SEDATION:
Moderate (conscious) sedation was employed during this procedure. A
total of Versed 1.0 mg and Fentanyl 50 mcg was administered
intravenously.

Moderate Sedation Time: 12 minutes. The patient's level of
consciousness and vital signs were monitored continuously by
radiology nursing throughout the procedure under my direct
supervision.

FLUOROSCOPY TIME:  Fluoroscopy Time: None.

COMPLICATIONS:
None immediate.

PROCEDURE:
Informed written consent was obtained from the patient after a
thorough discussion of the procedural risks, benefits and
alternatives. All questions were addressed. Maximal Sterile Barrier
Technique was utilized including caps, mask, sterile gowns, sterile
gloves, sterile drape, hand hygiene and skin antiseptic. A timeout
was performed prior to the initiation of the procedure.

Previous imaging reviewed. Patient positioned prone. Left kidney
lower pole was localized and marked.

Under sterile conditions and local anesthesia, the 15 gauge guide
needle was advanced to the left kidney lower pole. Needle position
confirmed with ultrasound. Through the access, 2 16 gauge core
biopsies obtained of the lower pole cortex. Images again obtained
for documentation. Samples were intact and non fragmented. These
were placed in saline. Needle tract occluded with Gel-Foam.
Postprocedure imaging demonstrates no large hematoma or immediate
complication.
IMPRESSION: Successful ultrasound left renal random core biopsy

## 2023-07-24 ENCOUNTER — Encounter: Payer: Self-pay | Admitting: Family

## 2023-07-24 ENCOUNTER — Ambulatory Visit: Payer: Self-pay | Admitting: Family

## 2023-07-24 ENCOUNTER — Other Ambulatory Visit (HOSPITAL_COMMUNITY)
Admission: RE | Admit: 2023-07-24 | Discharge: 2023-07-24 | Disposition: A | Discharge status: Home / Self Care | Payer: No Typology Code available for payment source | Source: Ambulatory Visit | Attending: Family | Admitting: Family

## 2023-07-24 VITALS — BP 142/94 | HR 67 | Temp 98.0°F | Ht 76.0 in | Wt 162.4 lb

## 2023-07-24 DIAGNOSIS — Z1159 Encounter for screening for other viral diseases: Secondary | ICD-10-CM | POA: Diagnosis not present

## 2023-07-24 DIAGNOSIS — J4520 Mild intermittent asthma, uncomplicated: Secondary | ICD-10-CM

## 2023-07-24 DIAGNOSIS — Z23 Encounter for immunization: Secondary | ICD-10-CM

## 2023-07-24 DIAGNOSIS — Z13220 Encounter for screening for lipoid disorders: Secondary | ICD-10-CM

## 2023-07-24 DIAGNOSIS — M3214 Glomerular disease in systemic lupus erythematosus: Secondary | ICD-10-CM

## 2023-07-24 DIAGNOSIS — Z113 Encounter for screening for infections with a predominantly sexual mode of transmission: Secondary | ICD-10-CM

## 2023-07-24 DIAGNOSIS — Z0000 Encounter for general adult medical examination without abnormal findings: Secondary | ICD-10-CM

## 2023-07-24 MED ORDER — ALBUTEROL SULFATE HFA 108 (90 BASE) MCG/ACT IN AERS: 2.0000 | INHALATION_SPRAY | RESPIRATORY_TRACT | 3 refills | Status: AC | PRN

## 2023-07-24 NOTE — Progress Notes (Signed)
Phone: [phone]  Subjective:  Patient 22 y.o. male presenting for annual physical.  Chief Complaint  Patient presents with   New Patient (Initial Visit)   Annual Exam    Non fasting w/ labs   Discussed the use of AI scribe software for clinical note transcription with the patient, who gave verbal consent to proceed.  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  He would also like to get a physical today. The patient works in [employer], Educational psychologist for [employer], and reports using albuterol as needed for asthma, typically during periods of increased physical activity. The patient has a history of lupus nephritis, diagnosed through a kidney biopsy, but has not been consistent with follow-up or medication management for this condition. The patient reports not liking to take medication and has not taken the prescribed medication for lupus. The patient is also due for routine vaccinations today.     See problem oriented charting- ROS- full  review of systems was completed and negative except for Asthma noted above.  The following were reviewed and entered/updated in epic: Past Medical History:  Diagnosis Date   Asthma    Chronic kidney disease    Eczema    Patient Active Problem List   Diagnosis Date Noted   Lupus nephritis (HCC) 07/24/2023   Mild intermittent asthma without complication 07/24/2023   Past Surgical History:  Procedure Laterality Date   IR US GUIDE BX ASP/DRAIN  01/27/2021    Family History  Problem Relation Age of Onset   High blood pressure Father    Diabetes Father     Medications- reviewed and updated Current Outpatient Medications  Medication Sig Dispense Refill   cetirizine (ZYRTEC) 10 MG tablet Take 10 mg by mouth daily.     mometasone (ELOCON) 0.1 % cream Apply 1 application topically 2 (two) times daily as  needed (eczema).     albuterol (VENTOLIN HFA) 108 (90 Base) MCG/ACT inhaler Inhale 2 puffs into the lungs every 4 (four) hours as needed for wheezing or shortness of breath. 1 each 3   No current facility-administered medications for this visit.    Allergies-reviewed and updated Allergies  Allergen Reactions   Other Anaphylaxis    All nuts except peanuts, patient can eat peanuts.    Social History   Social History Narrative   Not on file    Objective:  BP (!) 142/94   Pulse 67   Temp 98 F (36.7 C) (Temporal)   Ht 6\' 4"  (1.93 m)   Wt 162 lb 6.4 oz (73.7 kg)   SpO2 98%   BMI 19.77 kg/m  Physical Exam Vitals and nursing note reviewed.  Constitutional:      General: He is not in acute distress.    Appearance: Normal appearance.  HENT:     Head: Normocephalic.     Right Ear: Tympanic membrane and external ear normal.     Left Ear: Tympanic membrane and external ear normal.     Nose: Nose normal.     Mouth/Throat:     Mouth: Mucous membranes are moist.  Eyes:     Extraocular Movements: Extraocular movements intact.  Cardiovascular:     Rate and Rhythm: Normal rate and regular rhythm.  Pulmonary:     Effort: Pulmonary effort is normal.     Breath sounds: Normal  breath sounds.  Abdominal:     General: Abdomen is flat. There is no distension.     Palpations: Abdomen is soft.     Tenderness: There is no abdominal tenderness.  Musculoskeletal:        General: Normal range of motion.     Cervical back: Normal range of motion.  Skin:    General: Skin is warm and dry.  Neurological:     Mental Status: He is alert and oriented to person, place, and time.  Psychiatric:        Mood and Affect: Mood normal.        Behavior: Behavior normal.        Judgment: Judgment normal.      Assessment and Plan   Health Maintenance counseling: 1. Anticipatory guidance: Patient counseled regarding regular dental exams q6 months, eye exams yearly, avoiding smoking and second hand  smoke, limiting alcohol to 2 beverages per day.   2. Risk factor reduction:  Advised patient of need for regular exercise and diet rich in fruits and vegetables to reduce risk of heart attack and stroke.    Wt Readings from Last 3 Encounters:  07/24/23 162 lb 6.4 oz (73.7 kg)  01/27/21 160 lb (72.6 kg)  02/04/18 150 lb (68 kg) (47%, Z= -0.08)*   * Growth percentiles are based on CDC (Boys, 2-20 Years) data.   3. Immunizations/screenings/ancillary studies Immunization History  Administered Date(s) Administered   HPV 9-valent 07/20/2016, 07/24/2023   HPV Quadrivalent 08/27/2014   Hepatitis A, Ped/Adol-2 Dose 06/11/2013, 07/20/2016   Influenza,inj,Quad PF,6+ Mos 07/20/2016   Influenza,inj,quad, With Preservative 06/11/2013   Meningococcal Mcv4,unspecified 07/27/2011   PFIZER(Purple Top)SARS-COV-2 Vaccination 05/19/2020, 06/16/2020   Tdap 07/24/2023   Varicella 07/27/2011   Health Maintenance Due  Topic Date Due   HIV Screening  Never done   Hepatitis C Screening  Never done   INFLUENZA VACCINE  04/11/2023   COVID-19 Vaccine (3 - 2023-24 season) 05/12/2023    4. Skin cancer screening-  advised regular sunscreen use. Denies worrisome, changing, or new skin lesions.  5. Smoking associated screening:  non- smoker  6. STD screening - negative in Jan 2024, rechecking today 7. Alcohol screening: rare   Assessment and Plan    Lupus Nephritis - Per review of EMR, pt has history of lupus nephritis managed by Dr. Thedore Mins at [facility], but no visible notes. Referred by his previous PCP at [facility] (last seen 12/2022). Patient has been non-compliant with prescribed medication due to personal preference. Discussed the importance of medication adherence given the potential for organ damage and progression of disease. -Request records from [facility] to review previous management and medication. -Discuss findings with patient and reinforce the importance of medication  adherence.  Asthma - Uses albuterol inhaler as needed, primarily with exertion. No current use of maintenance inhaler. -Refilled albuterol prescription. -Advised patient to monitor frequency of albuterol use and to report if usage increases to more than once daily.  Vaccination Status - Incomplete HPV vaccination series and overdue for TdAP. -Administered second dose of HPV vaccine and TdAP vaccine today.  Annual Physical w/STD testing -  Last STD testing done in 09/2022 and negative. -Complete physical examination performed. -Ordered comprehensive lab work, including cholesterol panel, metabolic panel, blood count, and thyroid function tests. -Collect urine specimen for STD testing, -Results will be reviewed and communicated to the patient via MyChart.      Recommended follow up: Return for any future concerns, med refills. No future  appointments.   Lab/Order associations: non- fasting   Dulce Sellar, NP

## 2023-07-24 NOTE — Patient Instructions (Addendum)
Welcome to [facility], It was a pleasure meeting you today!   I will review your lab results via MyChart in a few days.  As discussed, I have sent your Albuterol refill to your pharmacy.  Call if any future concerns!   PLEASE NOTE: If you had any LAB tests please let us know if you have not heard back within a few days. You may see your results on MyChart before we have a chance to review them but we will give you a call once they are reviewed by [place]. If we ordered any REFERRALS today, please let us know if you have not heard from their office within the next week.  Let us know through MyChart if you are needing REFILLS, or have your pharmacy send [place] the request. You can also use MyChart to communicate with me or any office staff.  Please try these tips to maintain a healthy lifestyle: It is important that you exercise regularly at least 30 minutes 5 times a week. Think about what you will eat, plan ahead. Choose whole foods, & think  "clean, green, fresh or frozen" over canned, processed or packaged foods which are more sugary, salty, and fatty. 70 to 75% of food eaten should be fresh vegetables and protein. 2-3  meals daily with healthy snacks between meals, but must be whole fruit, protein or vegetables. Aim to eat over a 10 hour period when you are active, for example, 7am to 5pm, and then STOP after your last meal of the day, drinking only water.  Shorter eating windows, 6-8 hours, are showing benefits in heart disease and blood sugar regulation. Drink water every day! Shoot for 64 ounces daily = 8 cups, no other drink is as healthy! Fruit juice is best enjoyed in a healthy way, by EATING the fruit.

## 2023-07-25 LAB — CBC WITH DIFFERENTIAL/PLATELET
Basophils Absolute: 0.1 10*3/uL (ref 0.0–0.1)
Basophils Relative: 1.1 % (ref 0.0–3.0)
Eosinophils Absolute: 0.2 10*3/uL (ref 0.0–0.7)
Eosinophils Relative: 4.3 % (ref 0.0–5.0)
HCT: 47.7 % (ref 39.0–52.0)
Hemoglobin: 15.7 g/dL (ref 13.0–17.0)
Lymphocytes Relative: 40.4 % (ref 12.0–46.0)
Lymphs Abs: 1.9 10*3/uL (ref 0.7–4.0)
MCHC: 32.9 g/dL (ref 30.0–36.0)
MCV: 89.1 fL (ref 78.0–100.0)
Monocytes Absolute: 0.3 10*3/uL (ref 0.1–1.0)
Monocytes Relative: 6.8 % (ref 3.0–12.0)
Neutro Abs: 2.2 10*3/uL (ref 1.4–7.7)
Neutrophils Relative %: 47.4 % (ref 43.0–77.0)
Platelets: 195 10*3/uL (ref 150.0–400.0)
RBC: 5.36 Mil/uL (ref 4.22–5.81)
RDW: 13.2 % (ref 11.5–15.5)
WBC: 4.7 10*3/uL (ref 4.0–10.5)

## 2023-07-25 LAB — LIPID PANEL
Cholesterol: 131 mg/dL (ref 0–200)
HDL: 51.6 mg/dL (ref >39.00)
LDL Cholesterol: 61 mg/dL (ref 0–99)
NonHDL: 79.27
Total CHOL/HDL Ratio: 3
Triglycerides: 91 mg/dL (ref 0.0–149.0)
VLDL: 18.2 mg/dL (ref 0.0–40.0)

## 2023-07-25 LAB — COMPREHENSIVE METABOLIC PANEL
ALT: 16 U/L (ref 0–53)
AST: 20 U/L (ref 0–37)
Albumin: 4.5 g/dL (ref 3.5–5.2)
Alkaline Phosphatase: 83 U/L (ref 39–117)
BUN: 16 mg/dL (ref 6–23)
CO2: 30 meq/L (ref 19–32)
Calcium: 10.1 mg/dL (ref 8.4–10.5)
Chloride: 102 meq/L (ref 96–112)
Creatinine, Ser: 1.53 mg/dL — ABNORMAL HIGH (ref 0.40–1.50)
GFR: 63.32 mL/min (ref >60.00)
Glucose, Bld: 88 mg/dL (ref 70–99)
Potassium: 3.7 meq/L (ref 3.5–5.1)
Sodium: 139 meq/L (ref 135–145)
Total Bilirubin: 0.5 mg/dL (ref 0.2–1.2)
Total Protein: 7.4 g/dL (ref 6.0–8.3)

## 2023-07-25 LAB — HEPATITIS C ANTIBODY: Hepatitis C Ab: NONREACTIVE

## 2023-07-25 LAB — TSH: TSH: 0.63 u[IU]/mL (ref 0.35–5.50)

## 2023-07-25 LAB — HIV ANTIBODY (ROUTINE TESTING W REFLEX): HIV 1&2 Ab, 4th Generation: NONREACTIVE

## 2023-07-26 LAB — URINE CYTOLOGY ANCILLARY ONLY
Chlamydia: NEGATIVE
Comment: NEGATIVE
Comment: NEGATIVE
Comment: NORMAL
Neisseria Gonorrhea: NEGATIVE
Trichomonas: NEGATIVE

## 2023-07-31 ENCOUNTER — Encounter: Payer: Self-pay | Admitting: Family

## 2023-08-01 ENCOUNTER — Encounter: Payer: Self-pay | Admitting: Family

## 2023-08-01 ENCOUNTER — Ambulatory Visit: Payer: No Typology Code available for payment source | Admitting: Family

## 2023-08-01 VITALS — BP 137/91 | HR 76 | Ht 76.0 in

## 2023-08-01 DIAGNOSIS — L309 Dermatitis, unspecified: Secondary | ICD-10-CM | POA: Insufficient documentation

## 2023-08-01 DIAGNOSIS — J3089 Other allergic rhinitis: Secondary | ICD-10-CM | POA: Insufficient documentation

## 2023-08-01 DIAGNOSIS — N4889 Other specified disorders of penis: Secondary | ICD-10-CM | POA: Diagnosis not present

## 2023-08-01 DIAGNOSIS — L308 Other specified dermatitis: Secondary | ICD-10-CM | POA: Diagnosis not present

## 2023-08-01 MED ORDER — CETIRIZINE HCL 10 MG PO TABS: 10.0000 mg | ORAL_TABLET | Freq: Every day | ORAL | 5 refills | Status: AC

## 2023-08-01 MED ORDER — MOMETASONE FUROATE 0.1 % EX CREA: 1.0000 | TOPICAL_CREAM | Freq: Two times a day (BID) | CUTANEOUS | 2 refills | Status: AC | PRN

## 2023-08-01 NOTE — Progress Notes (Signed)
Patient ID: Juan Pacheco, male    DOB: [birth year], 22 y.o.   MRN: [id number]  Chief Complaint  Patient presents with   Skin Problem    Pt c/o dry skin on penis, Noticed a week ago After intercourse spot has been getting bigger.  Has tried aquaphor which did help slightly.    Eczema    Medication refill of mometasone cream.    Discussed the use of AI scribe software for clinical note transcription with the patient, who gave verbal consent to proceed.  History of Present Illness   The patient presents with a genital lesion that has been present for approximately two to three weeks. The lesion, initially small, has grown in size but has not drained. It is occasionally itchy but does not burn. The patient denies any correlation between the lesion and sexual intercourse, as he is sexually active and cannot pinpoint a specific encounter that may have led to the lesion. The patient denies any other symptoms and has no known history of sexually transmitted infections. The patient recently underwent testing for chlamydia, gonorrhea, trichomonas, HIV, and hepatitis C, but not herpes, as the patient did not have a sore at the time of testing. The patient is concerned about the possibility of herpes, but understands that testing is typically done when a sore is present. Pt admits to having 2 male partners currently with whom he is having sexual intercourse.  He also reports having year round allergies and takes generic Zyrtec for daily that controls his symptoms including runny nose, itchy/scratchy throat, itchy/runny eyes. He also has intermittent Eczema outbreaks on different areas of his body and uses a steroid cream as needed, Elocon. He is needing refills of both meds today.    Assessment & Plan:  Other eczema - - mometasone (ELOCON) 0.1 % cream; Apply 1 Application topically 2 (two) times daily as needed (Eczema).  Dispense: 45 g; Refill: 2  Perennial allergic rhinitis - - cetirizine (ZYRTEC) 10 MG  tablet; Take 1 tablet (10 mg total) by mouth daily.  Dispense: 30 tablet; Refill: 5  Genital Abrasion - Lesion present for approximately 2-3 weeks, initially smaller, no associated drainage, occasional itchiness. No immediate post-coital onset. Lesion appears to be an irritation, mild scaling possibly due to friction during intercourse. -Advised to apply Vaseline or A&D ointment a couple of times a day.  Sexually Transmitted Disease Screening - Chlamydia, gonorrhea, trichomonas, HIV, and Hep C tests were performed. Herpes testing was not performed due to lack of active sores. -Continue current STD prevention measures including condoms with every sexual interaction. -Pt advised an increase in partners increases risk of contracting an STD.    Subjective:    Outpatient Medications Prior to Visit  Medication Sig Dispense Refill   albuterol (VENTOLIN HFA) 108 (90 Base) MCG/ACT inhaler Inhale 2 puffs into the lungs every 4 (four) hours as needed for wheezing or shortness of breath. 1 each 3   cetirizine (ZYRTEC) 10 MG tablet Take 10 mg by mouth daily.     mometasone (ELOCON) 0.1 % cream Apply 1 application topically 2 (two) times daily as needed (eczema).     No facility-administered medications prior to visit.   Past Medical History:  Diagnosis Date   Asthma    Chronic kidney disease    Eczema    Past Surgical History:  Procedure Laterality Date   IR US GUIDE BX ASP/DRAIN  01/27/2021   Allergies  Allergen Reactions   Other Anaphylaxis    All  nuts except peanuts, patient can eat peanuts.      Objective:    Physical Exam Vitals and nursing note reviewed.  Constitutional:      General: He is not in acute distress.    Appearance: Normal appearance.  HENT:     Head: Normocephalic.  Cardiovascular:     Rate and Rhythm: Normal rate and regular rhythm.  Pulmonary:     Effort: Pulmonary effort is normal.     Breath sounds: Normal breath sounds.  Musculoskeletal:        General:  Normal range of motion.     Cervical back: Normal range of motion.  Skin:    General: Skin is warm and dry.     Findings: Abrasion (approx. 1.5cm area of scaling & mild erythema noted on distal anterior penis) present.  Neurological:     Mental Status: He is alert and oriented to person, place, and time.  Psychiatric:        Mood and Affect: Mood normal.    BP (!) 137/91   Pulse 76   Ht 6\' 4"  (1.93 m)   SpO2 98%   BMI 19.77 kg/m  Wt Readings from Last 3 Encounters:  07/24/23 162 lb 6.4 oz (73.7 kg)  01/27/21 160 lb (72.6 kg)  02/04/18 150 lb (68 kg) (47%, Z= -0.08)*   * Growth percentiles are based on CDC (Boys, 2-20 Years) data.      Dulce Sellar, NP

## 2023-09-30 ENCOUNTER — Ambulatory Visit: Payer: No Typology Code available for payment source | Admitting: Family

## 2023-09-30 ENCOUNTER — Encounter: Payer: Self-pay | Admitting: Family

## 2023-09-30 ENCOUNTER — Other Ambulatory Visit (HOSPITAL_COMMUNITY)
Admission: RE | Admit: 2023-09-30 | Discharge: 2023-09-30 | Disposition: A | Discharge status: Home / Self Care | Payer: BC Managed Care – PPO | Source: Ambulatory Visit | Attending: Family | Admitting: Family

## 2023-09-30 VITALS — BP 144/95 | HR 74 | Temp 98.4°F | Ht 76.0 in | Wt 160.8 lb

## 2023-09-30 DIAGNOSIS — M3214 Glomerular disease in systemic lupus erythematosus: Secondary | ICD-10-CM

## 2023-09-30 DIAGNOSIS — Z113 Encounter for screening for infections with a predominantly sexual mode of transmission: Secondary | ICD-10-CM

## 2023-09-30 NOTE — Progress Notes (Signed)
Patient ID: Juan Pacheco, male    DOB: November 03, 2000, 23 y.o.   MRN: [id number]  Chief Complaint  Patient presents with   STD testing    Pt denies sx, would like testing before sexual intercourse with new partner.    Discussed the use of AI scribe software for clinical note transcription with the patient, who gave verbal consent to proceed.  History of Present Illness   The patient, with a history of kidney disease, presents for a follow-up visit. They have not been able to see a nephrologist as previously planned. The patient acknowledges the seriousness of their condition and the potential for damage and shortened life expectancy if not properly managed. They agree to re-establish care with a nephrologist and will await a referral from the primary care provider.  The patient also mentions a recent test in November, possibly for sexually transmitted infections, but denies any current symptoms. They also discuss their work at [employer] and a side job related to Psychologist, occupational, which they find enjoyable and financially beneficial.  The patient also mentions a change in their insurance coverage, which now comes from their parents. They express concern about the upcoming need to find their own insurance once they are no longer eligible for coverage under their parents' plan.         Assessment & Plan:     Lupus nephritis -  Chronic; Patient has not followed up with nephrology as previously advised. The importance of renal health and potential consequences of neglect were discussed. -Referral to nephrology will be re-initiated. -Patient is advised to schedule and attend an appointment with nephrology.  Sexual Health - Patient requesting STI testing. No symptoms reported. -Order urine STI testing. Continue to advise on condom use with every sexual encounter. -REsults will be reviewed on MyChart.      Subjective:    Outpatient Medications Prior to Visit  Medication Sig Dispense Refill   albuterol  (VENTOLIN HFA) 108 (90 Base) MCG/ACT inhaler Inhale 2 puffs into the lungs every 4 (four) hours as needed for wheezing or shortness of breath. 1 each 3   cetirizine (ZYRTEC) 10 MG tablet Take 1 tablet (10 mg total) by mouth daily. (Patient not taking: Reported on 09/30/2023) 30 tablet 5   mometasone (ELOCON) 0.1 % cream Apply 1 Application topically 2 (two) times daily as needed (Eczema). (Patient not taking: Reported on 09/30/2023) 45 g 2   No facility-administered medications prior to visit.   Past Medical History:  Diagnosis Date   Asthma    Chronic kidney disease    Eczema    Past Surgical History:  Procedure Laterality Date   IR US GUIDE BX ASP/DRAIN  01/27/2021   Allergies  Allergen Reactions   Other Anaphylaxis    All nuts except peanuts, patient can eat peanuts.      Objective:    Physical Exam Vitals and nursing note reviewed.  Constitutional:      General: He is not in acute distress.    Appearance: Normal appearance.  HENT:     Head: Normocephalic.  Cardiovascular:     Rate and Rhythm: Normal rate and regular rhythm.  Pulmonary:     Effort: Pulmonary effort is normal.     Breath sounds: Normal breath sounds.  Musculoskeletal:        General: Normal range of motion.     Cervical back: Normal range of motion.  Skin:    General: Skin is warm and dry.  Neurological:  Mental Status: He is alert and oriented to person, place, and time.  Psychiatric:        Mood and Affect: Mood normal.   BP (!) 144/95 (BP Location: Left Arm, Patient Position: Sitting, Cuff Size: Normal)   Pulse 74   Temp 98.4 F (36.9 C) (Temporal)   Ht 6\' 4"  (1.93 m)   Wt 160 lb 12.8 oz (72.9 kg)   SpO2 99%   BMI 19.57 kg/m  Wt Readings from Last 3 Encounters:  09/30/23 160 lb 12.8 oz (72.9 kg)  07/24/23 162 lb 6.4 oz (73.7 kg)  01/27/21 160 lb (72.6 kg)       Dulce Sellar, NP

## 2023-09-30 NOTE — Assessment & Plan Note (Signed)
Patient has not followed up with nephrology as previously advised. The importance of renal health and potential consequences of neglect were discussed. -Referral to nephrology will be re-initiated. -Patient is advised to schedule and attend an appointment with nephrology.

## 2023-10-02 LAB — URINE CYTOLOGY ANCILLARY ONLY
Chlamydia: NEGATIVE
Comment: NEGATIVE
Comment: NEGATIVE
Comment: NORMAL
Neisseria Gonorrhea: NEGATIVE
Trichomonas: NEGATIVE

## 2023-10-03 ENCOUNTER — Encounter: Payer: Self-pay | Admitting: Family

## 2023-12-09 DIAGNOSIS — Z113 Encounter for screening for infections with a predominantly sexual mode of transmission: Secondary | ICD-10-CM | POA: Diagnosis not present

## 2023-12-09 DIAGNOSIS — Z13220 Encounter for screening for lipoid disorders: Secondary | ICD-10-CM | POA: Diagnosis not present

## 2023-12-09 DIAGNOSIS — J4520 Mild intermittent asthma, uncomplicated: Secondary | ICD-10-CM | POA: Diagnosis not present

## 2023-12-09 DIAGNOSIS — Z0000 Encounter for general adult medical examination without abnormal findings: Secondary | ICD-10-CM | POA: Diagnosis not present

## 2023-12-09 DIAGNOSIS — M3214 Glomerular disease in systemic lupus erythematosus: Secondary | ICD-10-CM | POA: Diagnosis not present

## 2024-08-27 ENCOUNTER — Other Ambulatory Visit: Payer: Self-pay

## 2024-08-27 ENCOUNTER — Emergency Department (HOSPITAL_COMMUNITY)

## 2024-08-27 ENCOUNTER — Emergency Department (HOSPITAL_COMMUNITY)
Admission: EM | Admit: 2024-08-27 | Discharge: 2024-08-28 | Attending: Emergency Medicine | Admitting: Emergency Medicine

## 2024-08-27 DIAGNOSIS — Z5321 Procedure and treatment not carried out due to patient leaving prior to being seen by health care provider: Secondary | ICD-10-CM | POA: Diagnosis not present

## 2024-08-27 DIAGNOSIS — R0602 Shortness of breath: Secondary | ICD-10-CM | POA: Insufficient documentation

## 2024-08-27 DIAGNOSIS — J45909 Unspecified asthma, uncomplicated: Secondary | ICD-10-CM | POA: Insufficient documentation

## 2024-08-27 DIAGNOSIS — R5383 Other fatigue: Secondary | ICD-10-CM | POA: Diagnosis not present

## 2024-08-27 LAB — CBC WITH DIFFERENTIAL/PLATELET
Abs Immature Granulocytes: 0.03 K/uL (ref 0.00–0.07)
Basophils Absolute: 0 K/uL (ref 0.0–0.1)
Basophils Relative: 1 %
Eosinophils Absolute: 0.2 K/uL (ref 0.0–0.5)
Eosinophils Relative: 3 %
HCT: 47.6 % (ref 39.0–52.0)
Hemoglobin: 15.7 g/dL (ref 13.0–17.0)
Immature Granulocytes: 0 %
Lymphocytes Relative: 14 %
Lymphs Abs: 1.2 K/uL (ref 0.7–4.0)
MCH: 28.7 pg (ref 26.0–34.0)
MCHC: 33 g/dL (ref 30.0–36.0)
MCV: 87 fL (ref 80.0–100.0)
Monocytes Absolute: 0.4 K/uL (ref 0.1–1.0)
Monocytes Relative: 5 %
Neutro Abs: 6.5 K/uL (ref 1.7–7.7)
Neutrophils Relative %: 77 %
Platelets: 219 K/uL (ref 150–400)
RBC: 5.47 MIL/uL (ref 4.22–5.81)
RDW: 12.4 % (ref 11.5–15.5)
WBC: 8.3 K/uL (ref 4.0–10.5)
nRBC: 0 % (ref 0.0–0.2)

## 2024-08-27 LAB — BASIC METABOLIC PANEL WITH GFR
Anion gap: 9 (ref 5–15)
BUN: 17 mg/dL (ref 6–20)
CO2: 26 mmol/L (ref 22–32)
Calcium: 10.4 mg/dL — ABNORMAL HIGH (ref 8.9–10.3)
Chloride: 105 mmol/L (ref 98–111)
Creatinine, Ser: 1.56 mg/dL — ABNORMAL HIGH (ref 0.61–1.24)
GFR, Estimated: >60 mL/min (ref >60)
Glucose, Bld: 127 mg/dL — ABNORMAL HIGH (ref 70–99)
Potassium: 3.9 mmol/L (ref 3.5–5.1)
Sodium: 140 mmol/L (ref 135–145)

## 2024-08-27 LAB — RESP PANEL BY RT-PCR (RSV, FLU A&B, COVID)  RVPGX2
Influenza A by PCR: NEGATIVE
Influenza B by PCR: NEGATIVE
Resp Syncytial Virus by PCR: NEGATIVE
SARS Coronavirus 2 by RT PCR: NEGATIVE

## 2024-08-27 LAB — TROPONIN T, HIGH SENSITIVITY: Troponin T High Sensitivity: <15 ng/L (ref 0–19)

## 2024-08-27 NOTE — ED Triage Notes (Signed)
 Pt complaining of shortness of breath today. Has been out of his inhaler. Chest feels tight

## 2024-08-27 NOTE — ED Provider Triage Note (Signed)
 Emergency Medicine Provider Triage Evaluation Note  Juan Pacheco , a 23 y.o. male  was evaluated in triage.  Pt complains of shortness of breath since about 4 PM.  Patient does have history of asthma.  Patient has not had to use inhaler tonight and reports she felt like her shortness of breath is getting worse and wanted be evaluated.  Patient denies any chest pain but does report he has had some congestion and fatigue the past couple days  Review of Systems  Positive: Shortness of breath, congestion Negative: Chest pain, syncope, dizziness  Physical Exam  BP 139/83 (BP Location: Right Arm)   Pulse (!) 104   Temp 98.4 F (36.9 C) (Oral)   Resp 18   SpO2 99%  Gen:   Awake, no distress   Resp:  Normal effort, lungs are clear to auscultation there was expiratory wheeze when coughing but no wheezing with normal respirations MSK:   Moves extremities without difficulty  Other:    Medical Decision Making  Medically screening exam initiated at 9:39 PM.  Appropriate orders placed.  Koron Godeaux was informed that the remainder of the evaluation will be completed by another provider, this initial triage assessment does not replace that evaluation, and the importance of remaining in the ED until their evaluation is complete.     Myriam Fonda RAMAN, [place] 08/27/24 2148

## 2024-08-28 MED ORDER — ALBUTEROL SULFATE (2.5 MG/3ML) 0.083% IN NEBU
2.5000 mg | INHALATION_SOLUTION | Freq: Once | RESPIRATORY_TRACT | Status: AC
  Administered 2024-08-28: 2.5 mg via RESPIRATORY_TRACT
  Filled 2024-08-28: qty 3

## 2024-08-28 NOTE — ED Notes (Signed)
 Pt stated he was leaving.
